# Patient Record
Sex: Female | Born: 1940 | Race: White | Hispanic: No | Marital: Married | Smoking: Current every day smoker
Health system: Southern US, Community
[De-identification: ages and names within clinical notes are randomized; demographics above are authoritative.]

## PROBLEM LIST (undated history)

## (undated) DIAGNOSIS — E785 Hyperlipidemia, unspecified: Secondary | ICD-10-CM

## (undated) DIAGNOSIS — F419 Anxiety disorder, unspecified: Secondary | ICD-10-CM

## (undated) DIAGNOSIS — I10 Essential (primary) hypertension: Secondary | ICD-10-CM

## (undated) DIAGNOSIS — J449 Chronic obstructive pulmonary disease, unspecified: Secondary | ICD-10-CM

## (undated) HISTORY — DX: Hyperlipidemia, unspecified: E78.5

## (undated) HISTORY — DX: Anxiety disorder, unspecified: F41.9

## (undated) HISTORY — DX: Essential (primary) hypertension: I10

## (undated) HISTORY — DX: Chronic obstructive pulmonary disease, unspecified: J44.9

---

## 2014-03-10 ENCOUNTER — Encounter: Payer: Self-pay | Admitting: Internal Medicine

## 2014-05-17 ENCOUNTER — Encounter: Payer: Self-pay | Admitting: Internal Medicine

## 2014-05-17 ENCOUNTER — Ambulatory Visit: Payer: Self-pay | Admitting: Internal Medicine

## 2014-05-17 NOTE — Progress Notes (Signed)
Patient ID: Sandra Sawyer, female   DOB: 06/10/1941, [age] y.o.   MRN: [id number]  The patient's chart has been reviewed by Dr. Rhea BeltonPyrtle  and the recommendations are noted below.  No follow up necessary Follow-up urgent.  Locate patient immediately. Follow-up necessary. Contact patient and schedule visit in ____ weeks. Follow-up advised. Contact patient and schedule visit in ____ weeks.  Outcome of communication with the patient:    Patient was to be seen as a new patient on referral, please notify referring physician of her No Show   Dr Rhea BeltonPyrtle the patient was scheduled with daughter  Molli KnockOkay, then please send her a letter stating she did not come for her appt and she can call to reschedule if she desires   Letter mailed

## 2015-08-14 HISTORY — PX: CHOLECYSTECTOMY: SHX55

## 2017-08-29 ENCOUNTER — Other Ambulatory Visit: Payer: Self-pay

## 2017-08-29 DIAGNOSIS — M79609 Pain in unspecified limb: Secondary | ICD-10-CM

## 2017-08-29 DIAGNOSIS — M7989 Other specified soft tissue disorders: Secondary | ICD-10-CM

## 2017-09-23 ENCOUNTER — Other Ambulatory Visit: Payer: Self-pay

## 2017-09-23 ENCOUNTER — Ambulatory Visit (HOSPITAL_COMMUNITY)
Admission: RE | Admit: 2017-09-23 | Discharge: 2017-09-23 | Disposition: A | Discharge status: Home / Self Care | Payer: Medicare Other | Source: Ambulatory Visit | Attending: Surgery | Admitting: Surgery

## 2017-09-23 ENCOUNTER — Ambulatory Visit (INDEPENDENT_AMBULATORY_CARE_PROVIDER_SITE_OTHER): Payer: Medicare Other | Admitting: Surgery

## 2017-09-23 ENCOUNTER — Encounter: Payer: Self-pay | Admitting: Surgery

## 2017-09-23 VITALS — BP 132/76 | HR 80 | Temp 98.5°F | Resp 14 | Ht 60.0 in | Wt 137.0 lb

## 2017-09-23 DIAGNOSIS — I872 Venous insufficiency (chronic) (peripheral): Secondary | ICD-10-CM | POA: Diagnosis not present

## 2017-09-23 DIAGNOSIS — M7989 Other specified soft tissue disorders: Secondary | ICD-10-CM | POA: Diagnosis not present

## 2017-09-23 DIAGNOSIS — R936 Abnormal findings on diagnostic imaging of limbs: Secondary | ICD-10-CM | POA: Insufficient documentation

## 2017-09-23 DIAGNOSIS — M79609 Pain in unspecified limb: Secondary | ICD-10-CM

## 2017-09-23 NOTE — Progress Notes (Signed)
[facility]ist of [facility]  Patient name: Sandra Sawyer MRN: [id number] DOB: [birth year] Sex: female   REQUESTING PROVIDER:    Dr. Hilma Favors   REASON FOR CONSULT:    Pain and swelling in legs  HISTORY OF PRESENT ILLNESS:   Sandra Sawyer is a 77 y.o. female, who is referred today for evaluation of leg pain.  The patient states that it has been going on for at least 2 years.  Her left leg bothers her more than the right.  She describes her legs as getting weak, aching, and giving way with activity from her hips down.  She also complains that they burn and staying and swell.  She describes it as if somebody is lighting a fire under her feet.   Patient suffers from hypertension which is medically managed.  She takes a statin for hypercholesterolemia.  She has a history of COPD secondary to tobacco abuse.  She is on home oxygen.  PAST MEDICAL HISTORY    Past Medical History:  Diagnosis Date  . Anxiety   . COPD (chronic obstructive pulmonary disease) (HCC)   . Hyperlipidemia   . Hypertension      FAMILY HISTORY   CAD- daughter  SOCIAL HISTORY:   Social History   Socioeconomic History  . Marital status: Married    Spouse name: Not on file  . Number of children: Not on file  . Years of education: Not on file  . Highest education level: Not on file  Social Needs  . Financial resource strain: Not on file  . Food insecurity - worry: Not on file  . Food insecurity - inability: Not on file  . Transportation needs - medical: Not on file  . Transportation needs - non-medical: Not on file  Occupational History  . Not on file  Tobacco Use  . Smoking status: Not on file  Substance and Sexual Activity  . Alcohol use: No    Frequency: Never  . Drug use: No  . Sexual activity: No  Other Topics Concern  . Not on file  Social History Narrative  . Not on file    ALLERGIES:    Allergies no known allergies  CURRENT MEDICATIONS:     Current Outpatient Medications  Medication Sig Dispense Refill  . acetaminophen (TYLENOL) 325 MG tablet Take 650 mg by mouth every 6 (six) hours as needed.    Marland Kitchen albuterol (PROVENTIL HFA;VENTOLIN HFA) 108 (90 Base) MCG/ACT inhaler Inhale into the lungs every 6 (six) hours as needed for wheezing or shortness of breath.    Marland Kitchen amLODipine (NORVASC) 5 MG tablet Take 5 mg by mouth daily.    Marland Kitchen aspirin 81 MG chewable tablet Chew by mouth daily.    Marland Kitchen atorvastatin (LIPITOR) 80 MG tablet Take 80 mg by mouth daily.    . busPIRone (BUSPAR) 5 MG tablet Take 5 mg by mouth 3 (three) times daily.    . diazepam (VALIUM) 10 MG tablet Take 10 mg by mouth every 6 (six) hours as needed for anxiety.    . furosemide (LASIX) 20 MG tablet Take 20 mg by mouth.    . gabapentin (NEURONTIN) 100 MG capsule Take 100 mg by mouth 3 (three) times daily.    . nitroGLYCERIN (NITROSTAT) 0.4 MG SL tablet Place 0.4 mg under the tongue every 5 (five) minutes as needed for chest pain.    Marland Kitchen omeprazole (PRILOSEC) 20 MG capsule Take 20 mg by mouth daily.    . ondansetron (ZOFRAN) 4  MG tablet Take 4 mg by mouth every 8 (eight) hours as needed for nausea or vomiting.    Marland Kitchen oxyCODONE (OXY IR/ROXICODONE) 5 MG immediate release tablet Take 10 mg by mouth every 4 (four) hours as needed for severe pain.    . polyethylene glycol powder (GLYCOLAX) powder Take 1 Container by mouth once.    . sucralfate (CARAFATE) 1 GM/10ML suspension Take 1 g by mouth 4 (four) times daily -  with meals and at bedtime.    Marland Kitchen tiZANidine (ZANAFLEX) 4 MG capsule Take 4 mg by mouth 3 (three) times daily.    Marland Kitchen umeclidinium-vilanterol (ANORO ELLIPTA) 62.5-25 MCG/INH AEPB Inhale 1 puff into the lungs daily.    Marland Kitchen venlafaxine (EFFEXOR) 75 MG tablet Take 75 mg by mouth 2 (two) times daily.    Marland Kitchen zolpidem (AMBIEN) 10 MG tablet Take 10 mg by mouth at bedtime as needed for sleep.     No current facility-administered medications for this visit.     REVIEW OF SYSTEMS:   [X]   denotes positive finding, [ ]  denotes negative finding Cardiac  Comments:  Chest pain or chest pressure: x   Shortness of breath upon exertion: x   Short of breath when lying flat:    Irregular heart rhythm:        Vascular    Pain in calf, thigh, or hip brought on by ambulation:    Pain in feet at night that wakes you up from your sleep:  x   Blood clot in your veins:    Leg swelling:  x       Pulmonary    Oxygen at home: x   Productive cough:     Wheezing:         Neurologic    Sudden weakness in arms or legs:  x   Sudden numbness in arms or legs:  x   Sudden onset of difficulty speaking or slurred speech:    Temporary loss of vision in one eye:     Problems with dizziness:  x       Gastrointestinal    Blood in stool:      Vomited blood:         Genitourinary    Burning when urinating:     Blood in urine:        Psychiatric    Major depression:         Hematologic    Bleeding problems:    Problems with blood clotting too easily:        Skin    Rashes or ulcers:        Constitutional    Fever or chills: x    PHYSICAL EXAM:   There were no vitals filed for this visit.  GENERAL: The patient is a well-nourished female, in no acute distress. The vital signs are documented above. CARDIAC: There is a regular rate and rhythm.  VASCULAR: Palpable pedal pulses with brisk Doppler signals in bilateral dorsalis pedis and posterior tibial arteries.  1+ edema bilaterally. PULMONARY: Nonlabored respirations MUSCULOSKELETAL: There are no major deformities or cyanosis. NEUROLOGIC: No focal weakness or paresthesias are detected. SKIN: There are no ulcers or rashes noted. PSYCHIATRIC: The patient has a normal affect.  STUDIES:   I have ordered and reviewed her vascular lab studies.  This shows abnormal reflux times within the right common femoral and great saphenous vein at the saphenofemoral junction.  On the left there were reflux noted in the common femoral vein,  great  saphenous vein at the saphenofemoral junction and the small saphenous vein  ASSESSMENT and PLAN   Leg pain: I do not think that the patient would benefit from endovenous laser ablation of her saphenous vein as the veins are relatively small and the reflux is minimal.  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  I have given her information on how to obtain these.  I do not think that the patient has any evidence of arterial insufficiency as she has palpable pedal pulses bilaterally and triphasic signals with hand-held Doppler.  I suspect that the neuropathic pain she is having is either secondary to disease in her lower back or possibly peripheral neuropathy.  She is going to try and schedule an appointment to see her neurologist.  I told her she could increase her Neurontin if she would like to.  The patient is to contact me if she has any other questions or concerns.  Otherwise I will see her back on an as-needed basis.   Durene CalWells Tahiri Shareef, MD [facility] Tel [phone] Pager [phone]

## 2017-11-04 ENCOUNTER — Encounter (HOSPITAL_COMMUNITY): Payer: Medicare Other

## 2017-11-04 ENCOUNTER — Encounter: Payer: Medicare Other | Admitting: Surgery

## 2019-03-05 ENCOUNTER — Other Ambulatory Visit: Payer: Self-pay | Admitting: Orthopedic Surgery

## 2019-03-09 ENCOUNTER — Other Ambulatory Visit: Payer: Self-pay | Admitting: Orthopedic Surgery

## 2019-03-09 DIAGNOSIS — M25831 Other specified joint disorders, right wrist: Secondary | ICD-10-CM

## 2019-03-20 ENCOUNTER — Other Ambulatory Visit: Payer: Medicare Other

## 2019-10-13 ENCOUNTER — Emergency Department (INDEPENDENT_AMBULATORY_CARE_PROVIDER_SITE_OTHER)
Admission: EM | Admit: 2019-10-13 | Discharge: 2019-10-13 | Disposition: A | Discharge status: Home / Self Care | Payer: Medicare Other | Source: Home / Self Care

## 2019-10-13 ENCOUNTER — Emergency Department (INDEPENDENT_AMBULATORY_CARE_PROVIDER_SITE_OTHER): Payer: Medicare Other

## 2019-10-13 ENCOUNTER — Other Ambulatory Visit: Payer: Self-pay

## 2019-10-13 DIAGNOSIS — R0602 Shortness of breath: Secondary | ICD-10-CM | POA: Diagnosis not present

## 2019-10-13 DIAGNOSIS — R202 Paresthesia of skin: Secondary | ICD-10-CM

## 2019-10-13 DIAGNOSIS — J432 Centrilobular emphysema: Secondary | ICD-10-CM

## 2019-10-13 MED ORDER — PREDNISONE 10 MG PO TABS: 20.0000 mg | ORAL_TABLET | Freq: Every day | ORAL | 0 refills | Status: AC

## 2019-10-13 MED ORDER — DOXYCYCLINE HYCLATE 100 MG PO CAPS: 100.0000 mg | ORAL_CAPSULE | Freq: Two times a day (BID) | ORAL | 0 refills | Status: AC

## 2019-10-13 NOTE — ED Triage Notes (Signed)
Patient presents to Urgent Care with complaints of shortness of breath and chest tightness since almost a month ago after she got her second covid vaccine. Patient reports she has COPD, completed a course of antibiotics last month when her sx first began. Pulmonologist prescribed an antibiotic yesterday for the patient but she has not taken it because she is afraid of one of the side effects (tingling in hands and feet). Patient was told by her pulmonologist today to go to the ED immediately but she did not want to because last time she was there too long and was admitted with COVID patients (later left AMA).

## 2019-10-13 NOTE — ED Provider Notes (Signed)
[facility]    CSN: [id number] Arrival date & time: 10/13/19  1600      History   Chief Complaint Chief Complaint  Patient presents with  . Shortness of Breath    HPI Sandra Sawyer is a 79 y.o. female.   HPI  Patient presents today with a concern for worsening shortness of breath and cough. Patient medical history is significant for emphysema, chronic hypoxia, polypharmacy-including chronic opioid use and lung mass noted on recent CT of chest. Patient is followed by [facility] pulmonology recently treated with a course of Augmentin and prednisone without improvement. Pulmonologist prescribed Levaquin and refused to take medication due to fear of worsening paraesthesias involving bilateral hands and bilateral lower extremities. These symptoms of paraesthesia have remained present for several weeks. She denies history B-12 deficiency and or diabetes. Patient is prescribed home oxygen an has not used at all recently. Oxygen saturation today 98% on RA. She is mildly tachypnec. She reports over the last month symptoms of cough and shortness has worsened. Denies chest pain or tightness, dizziness, or generalized weakness..  Past Medical History:  Diagnosis Date  . Anxiety   . COPD (chronic obstructive pulmonary disease) (HCC)   . Hyperlipidemia   . Hypertension     There are no problems to display for this patient.   Past Surgical History:  Procedure Laterality Date  . CHOLECYSTECTOMY  2017    OB History   No obstetric history on file.      Home Medications    Prior to Admission medications   Medication Sig Start Date End Date Taking? Authorizing Provider  levofloxacin (LEVAQUIN) 750 MG tablet Take by mouth. 10/12/19 10/17/19 Yes [provider]  levothyroxine (SYNTHROID) 50 MCG tablet TAKE 1 TABLET (50 MCG TOTAL) BY MOUTH DAILY AT 0600. 10/05/19  Yes [provider]  acetaminophen (TYLENOL) 325 MG tablet Take 650 mg by mouth every 6 (six) hours as needed.     [provider]  albuterol (PROVENTIL HFA;VENTOLIN HFA) 108 (90 Base) MCG/ACT inhaler Inhale into the lungs every 6 (six) hours as needed for wheezing or shortness of breath.    [provider]  amLODipine (NORVASC) 5 MG tablet Take 5 mg by mouth daily.    [provider]  aspirin 81 MG chewable tablet Chew by mouth daily.    [provider]  atorvastatin (LIPITOR) 80 MG tablet Take 80 mg by mouth daily.    [provider]  busPIRone (BUSPAR) 5 MG tablet Take 5 mg by mouth 3 (three) times daily.    [provider]  diazepam (VALIUM) 10 MG tablet Take 10 mg by mouth every 6 (six) hours as needed for anxiety.    [provider]  doxycycline (VIBRAMYCIN) 100 MG capsule Take 1 capsule (100 mg total) by mouth 2 (two) times daily. 10/13/19   Bing Neighbors, FNP  furosemide (LASIX) 20 MG tablet Take 20 mg by mouth.    [provider]  gabapentin (NEURONTIN) 100 MG capsule Take 100 mg by mouth 3 (three) times daily.    [provider]  levothyroxine (SYNTHROID) 25 MCG tablet Take by mouth.    [provider]  nitroGLYCERIN (NITROSTAT) 0.4 MG SL tablet Place 0.4 mg under the tongue every 5 (five) minutes as needed for chest pain.    [provider]  omeprazole (PRILOSEC) 20 MG capsule Take 20 mg by mouth daily.    [provider]  ondansetron (ZOFRAN) 4 MG tablet Take  4 mg by mouth every 8 (eight) hours as needed for nausea or vomiting.    [provider]  oxyCODONE (OXY IR/ROXICODONE) 5 MG immediate release tablet Take 10 mg by mouth every 4 (four) hours as needed for severe pain.    [provider]  polyethylene glycol powder (GLYCOLAX) powder Take 1 Container by mouth once.    [provider]  predniSONE (DELTASONE) 10 MG tablet Take 2 tablets (20 mg total) by mouth daily with breakfast for 5 days. 10/13/19 10/18/19  Scot Jun, FNP  sucralfate (CARAFATE) 1 GM/10ML  suspension Take 1 g by mouth 4 (four) times daily -  with meals and at bedtime.    [provider]  tiZANidine (ZANAFLEX) 4 MG capsule Take 4 mg by mouth 3 (three) times daily.    [provider]  umeclidinium-vilanterol (ANORO ELLIPTA) 62.5-25 MCG/INH AEPB Inhale 1 puff into the lungs daily.    [provider]  venlafaxine (EFFEXOR) 75 MG tablet Take 75 mg by mouth 2 (two) times daily.    [provider]  zolpidem (AMBIEN) 10 MG tablet Take 10 mg by mouth at bedtime as needed for sleep.    [provider]    Family History Family History  Problem Relation Age of Onset  . Cancer Mother   . Heart disease Mother     Social History Social History   Tobacco Use  . Smoking status: Current Every Day Smoker    Types: E-cigarettes  . Smokeless tobacco: Never Used  Substance Use Topics  . Alcohol use: No  . Drug use: No     Allergies   Patient has no known allergies.   Review of Systems Review of Systems Pertinent negatives listed in HPI  Physical Exam Triage Vital Signs ED Triage Vitals  Enc Vitals Group     BP 10/13/19 1620 138/75     Pulse Rate 10/13/19 1620 75     Resp 10/13/19 1620 (!) 24     Temp 10/13/19 1620 99.2 F (37.3 C)     Temp Source 10/13/19 1620 Oral     SpO2 10/13/19 1620 98 %     Weight 10/13/19 1615 130 lb (59 kg)     Height 10/13/19 1615 5\' 2"  (1.575 m)     Head Circumference --      Peak Flow --      Pain Score 10/13/19 1615 4     Pain Loc --      Pain Edu? --      Excl. in [place]? --    No data found.  Updated Vital Signs BP 138/75 (BP Location: Right Arm)   Pulse 75   Temp 99.2 F (37.3 C) (Oral)   Resp (!) 24   Ht 5\' 2"  (1.575 m)   Wt 130 lb (59 kg)   SpO2 98%   BMI 23.78 kg/m   Visual Acuity Right Eye Distance:   Left Eye Distance:   Bilateral Distance:    Right Eye Near:   Left Eye Near:    Bilateral Near:     Physical Exam General appearance: alert, chronically ill appearing,  very thin appearance,  no distress Head: Normocephalic, without obvious abnormality, atraumatic Respiratory: Respirations even,  lung sounds diminished, rhonchi noted upper lobes bilaterally,  normal respiratory rate (with deep breathing) Heart: rate and rhythm normal. No gallop or murmurs noted on exam  Abdomen: BS +, no distention, no rebound tenderness, or no mass Extremities: No gross deformities  Skin: Skin color, texture, turgor normal. No rashes seen  Psych: Anxious and rapid pressured speed. Neurologic: Alert, oriented to person, place, and time, thought content appropriate. Generalized weakness (pt in wheelchair)  UC Treatments / Results  Labs (all labs ordered are listed, but only abnormal results are displayed) Labs Reviewed  COMPLETE METABOLIC PANEL WITH GFR    EKG   Radiology DG Chest 2 View  Result Date: 10/13/2019 CLINICAL DATA:  Shortness of breath. Recent diagnosis of pneumonia on 09/24/2019. Persistent cough. History of emphysema. EXAM: CHEST - 2 VIEW COMPARISON:  None. FINDINGS: The heart size and pulmonary vascularity are normal. No infiltrates or effusions. Slight accentuation of the interstitial markings. Aortic atherosclerosis. No bone abnormality. IMPRESSION: Slight accentuation of the interstitial markings. This could be acute or chronic. Aortic Atherosclerosis (ICD10-I70.0). Otherwise, normal exam. Electronically Signed   By: Francene Boyers M.D.   On: 10/13/2019 17:09    Procedures Procedures (including critical care time)  Medications Ordered in UC Medications - No data to display  Initial Impression / Assessment and Plan / UC Course  I have reviewed the triage vital signs and the nursing notes.  Pertinent labs & imaging results that were available during my care of the patient were reviewed by me and considered in my medical decision making (see chart for details).     Final Clinical Impressions(s) / UC Diagnoses   Final diagnoses:  SOB (shortness of  breath)  Centrilobular emphysema (HCC)  Paresthesias  Chest x-ray negative for any acute findings.  Suspect current symptoms of cough and shortness of breath is related to chronic emphysema. Patient refuses to take Levoquin prescribed by pulmonologist due to fear of side effects. Will trial a course of doxycyline and repeat a short course of prednisone. Vital signs are reassuring and patient is not acutely ill appearing, however is very anxious. CMP pending to evaluate electrolyte and renal function as patient is complaining of paraesthesias. Strict ER precautions given if symptoms worsen and recommended follow-up with pulmonology and PCP.    Discharge Instructions     I am starting you on doxycycline 100 mg twice daily for 10 days.  I am also placing you on another short course of prednisone 40 mg x 5 days.  If the numbness and tingling continues in your fingers and in your lower extremities I would recommend follow-up for further blood studies to rule out a B12 deficiency.  If numbness and tingling worsens I would go to the emergency department.  I have collected a CMP to check your renal function, liver function and electrolyte status.  Those results will not be available until 24 to 48 hours.  For if any of your symptoms worsen please go immediately to the emergency room.  Continue close follow-up with your pulmonologist.   ED Prescriptions    Medication Sig Dispense Auth. Provider   doxycycline (VIBRAMYCIN) 100 MG capsule Take 1 capsule (100 mg total) by mouth 2 (two) times daily. 20 capsule Bing Neighbors, FNP   predniSONE (DELTASONE) 10 MG tablet Take 2 tablets (20 mg total) by mouth daily with breakfast for 5 days. 10 tablet Bing Neighbors, FNP     PDMP not reviewed this encounter.   Bing Neighbors, FNP 10/14/19 1131

## 2019-10-13 NOTE — Discharge Instructions (Addendum)
I am starting you on doxycycline 100 mg twice daily for 10 days.  I am also placing you on another short course of prednisone 40 mg x 5 days.  If the numbness and tingling continues in your fingers and in your lower extremities I would recommend follow-up for further blood studies to rule out a B12 deficiency.  If numbness and tingling worsens I would go to the emergency department.  I have collected a CMP to check your renal function, liver function and electrolyte status.  Those results will not be available until 24 to 48 hours.  For if any of your symptoms worsen please go immediately to the emergency room.  Continue close follow-up with your pulmonologist.

## 2019-10-14 LAB — COMPLETE METABOLIC PANEL WITH GFR
AG Ratio: 1.9 (calc) (ref 1.0–2.5)
ALT: 13 U/L (ref 6–29)
AST: 20 U/L (ref 10–35)
Albumin: 4.2 g/dL (ref 3.6–5.1)
Alkaline phosphatase (APISO): 95 U/L (ref 37–153)
BUN: 12 mg/dL (ref 7–25)
CO2: 31 mmol/L (ref 20–32)
Calcium: 9.5 mg/dL (ref 8.6–10.4)
Chloride: 102 mmol/L (ref 98–110)
Creat: 0.87 mg/dL (ref 0.60–0.93)
GFR, Est African American: 74 mL/min/{1.73_m2} (ref >=60)
GFR, Est Non African American: 64 mL/min/{1.73_m2} (ref >=60)
Globulin: 2.2 g/dL (calc) (ref 1.9–3.7)
Glucose, Bld: 99 mg/dL (ref 65–99)
Potassium: 3.6 mmol/L (ref 3.5–5.3)
Sodium: 141 mmol/L (ref 135–146)
Total Bilirubin: 0.5 mg/dL (ref 0.2–1.2)
Total Protein: 6.4 g/dL (ref 6.1–8.1)

## 2020-09-14 IMAGING — DX DG CHEST 2V
2 series · 2 of 2 positions shown · non-contrast
Comparison: None.

CLINICAL DATA: Shortness of breath. Recent diagnosis of pneumonia
on 09/24/2019. Persistent cough. History of emphysema.

EXAM:
CHEST - 2 VIEW

[chest pa]
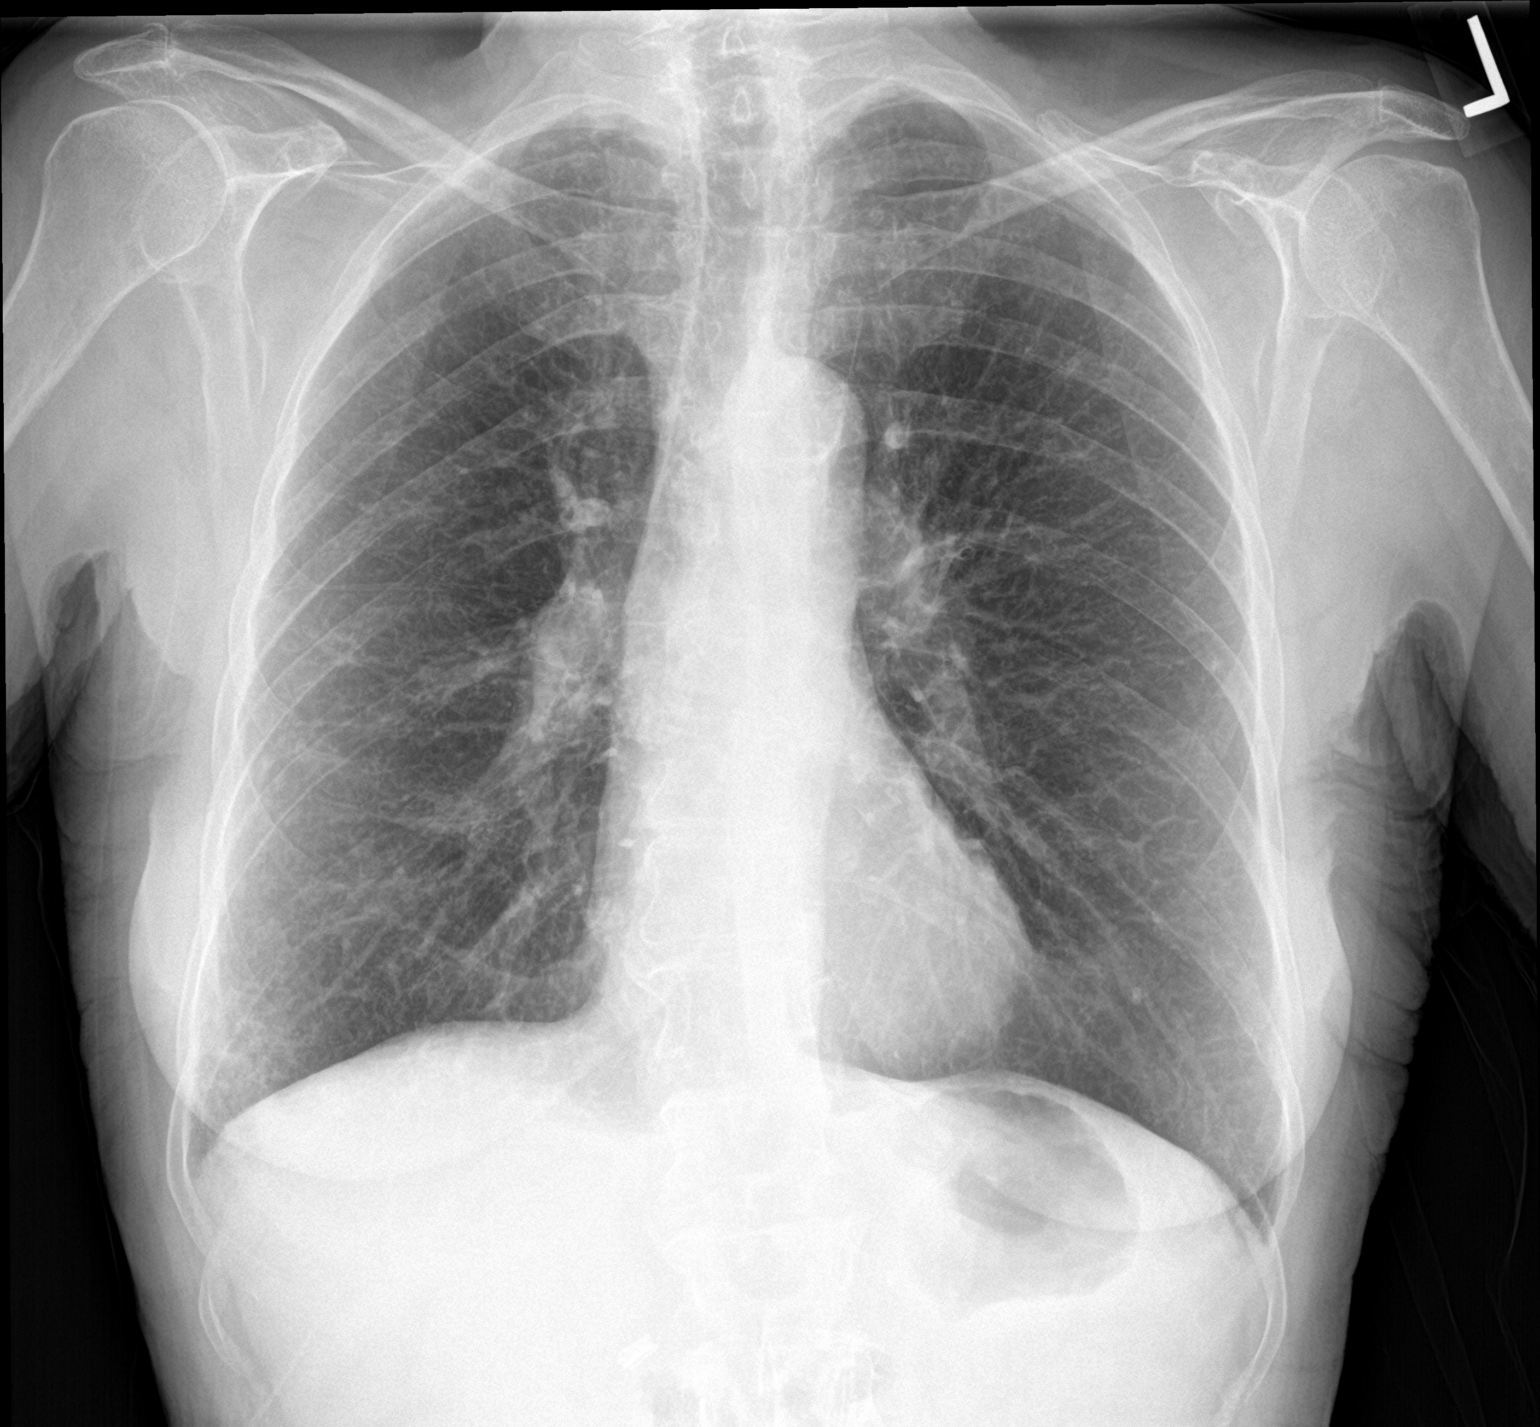

[chest lat]
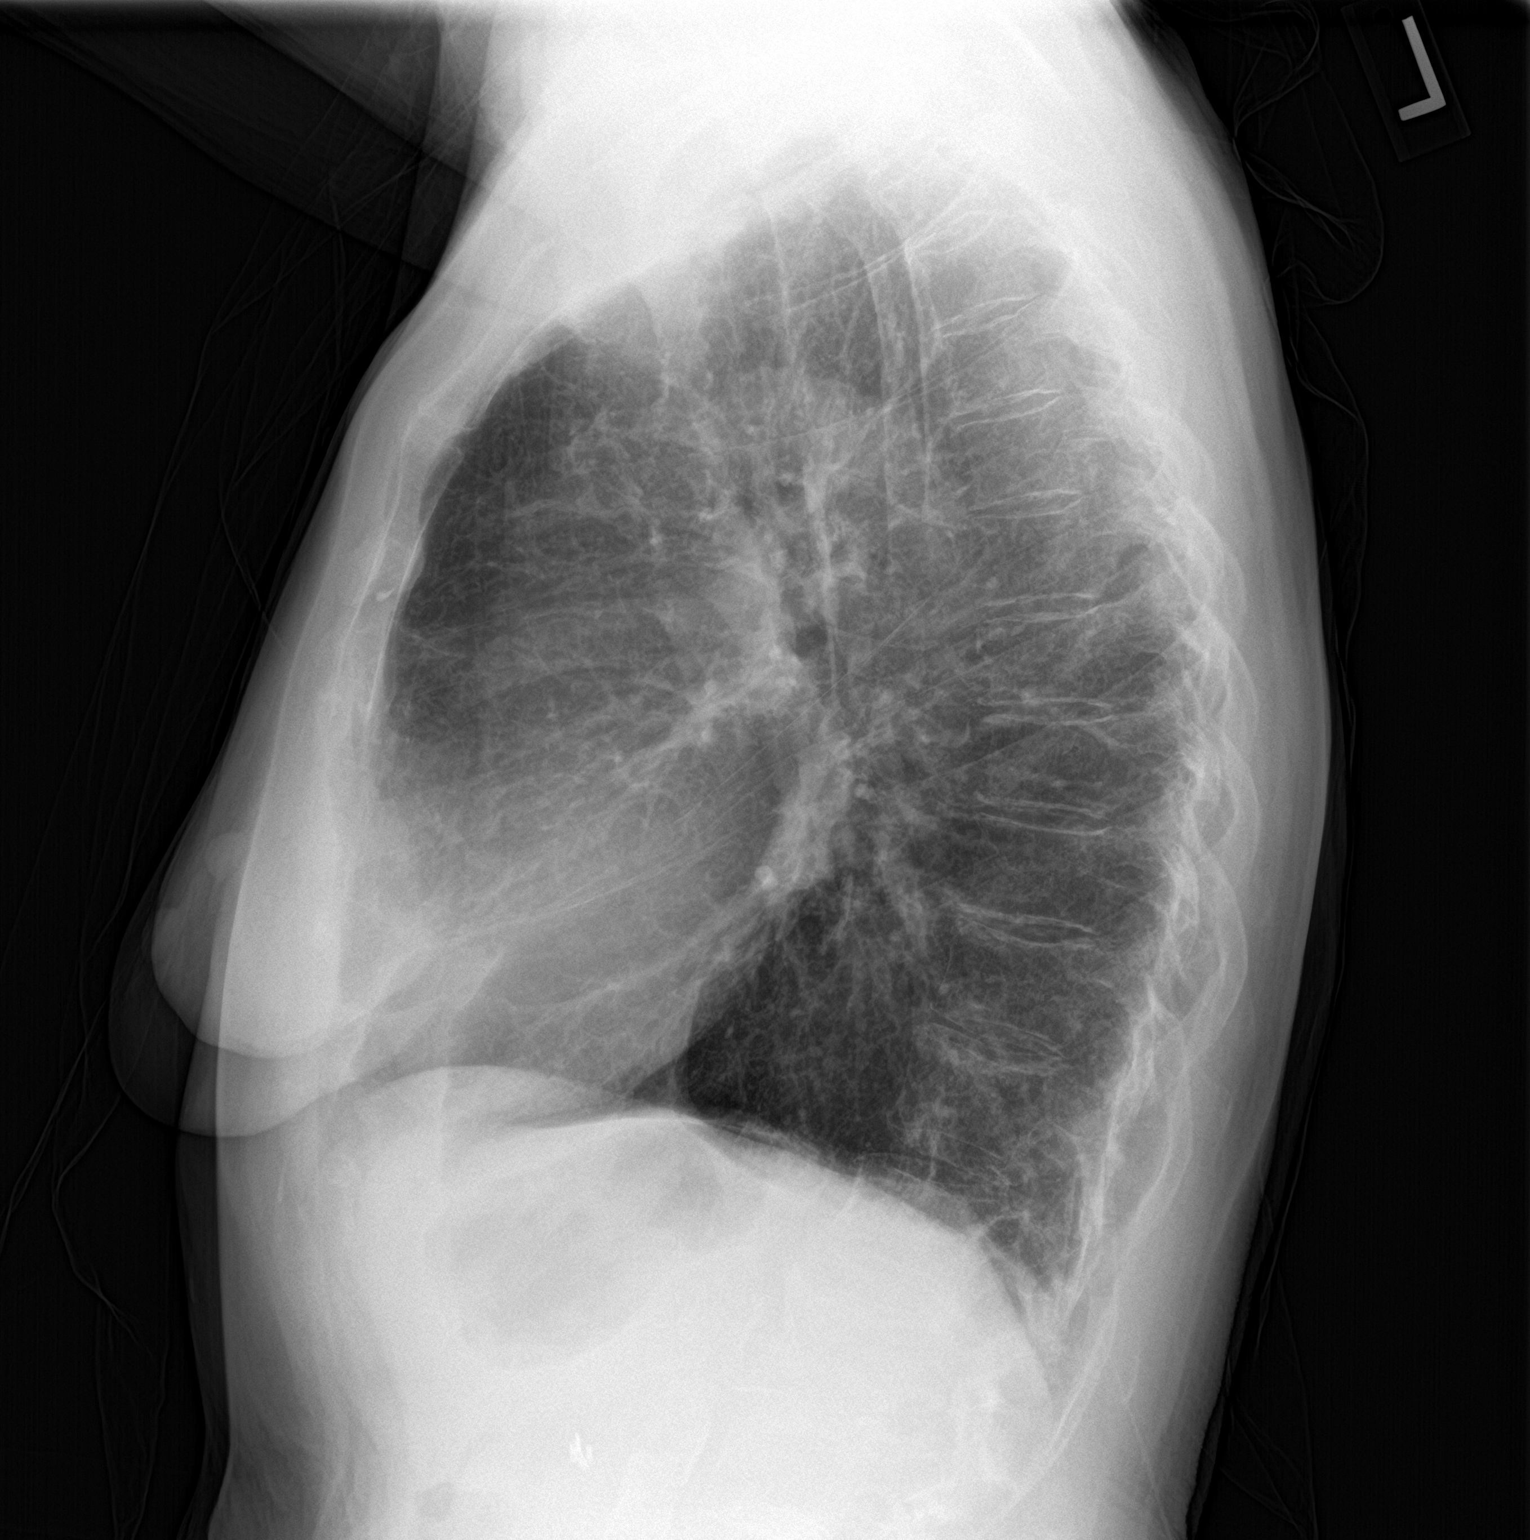

[2 of 2 positions shown; findings below may reference images not displayed]

FINDINGS: The heart size and pulmonary vascularity are normal. No infiltrates
or effusions. Slight accentuation of the interstitial markings.

Aortic atherosclerosis. No bone abnormality.
IMPRESSION: Slight accentuation of the interstitial markings. This could be
acute or chronic.

Aortic Atherosclerosis ([id number].S).

Otherwise, normal exam.

## 2023-12-12 DEATH — deceased
